# Patient Record
Sex: Female | Born: 2000
Health system: Midwestern US, Community
[De-identification: ages and names within clinical notes are randomized; demographics above are authoritative.]

## PROBLEM LIST (undated history)

## (undated) DIAGNOSIS — J45909 Unspecified asthma, uncomplicated: Secondary | ICD-10-CM

## (undated) DIAGNOSIS — J939 Pneumothorax, unspecified: Secondary | ICD-10-CM

## (undated) DIAGNOSIS — K828 Other specified diseases of gallbladder: Secondary | ICD-10-CM

## (undated) DIAGNOSIS — K812 Acute cholecystitis with chronic cholecystitis: Secondary | ICD-10-CM

---
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
## 2019-08-14 IMAGING — CR DG WRIST COMPLETE 3+V*L*
4 series · 4 of 4 positions shown · non-contrast
Comparison: None.

CLINICAL DATA: Persistent pain after falling down steps today

EXAM:
LEFT WRIST - COMPLETE 3+ VIEW

[wrist pa]
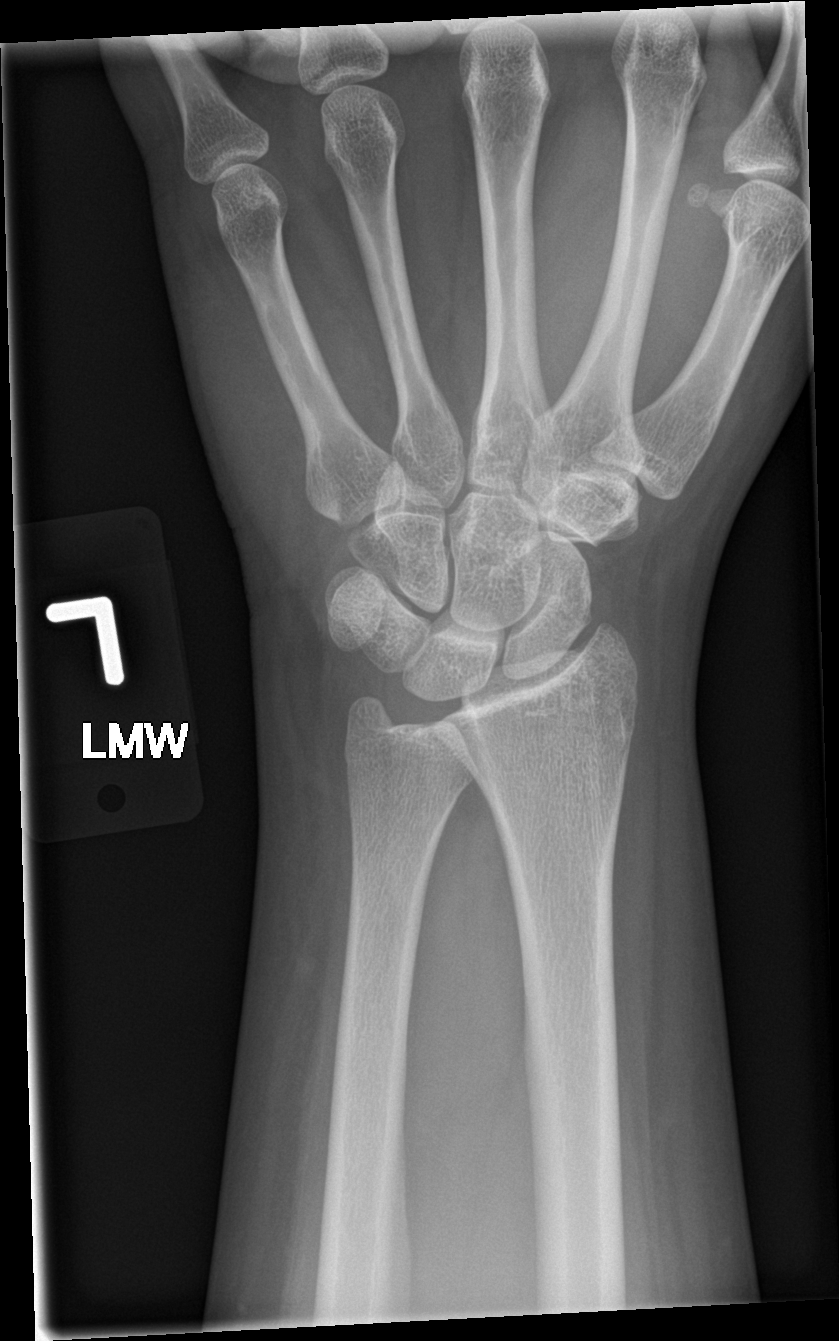

[wrist obl]
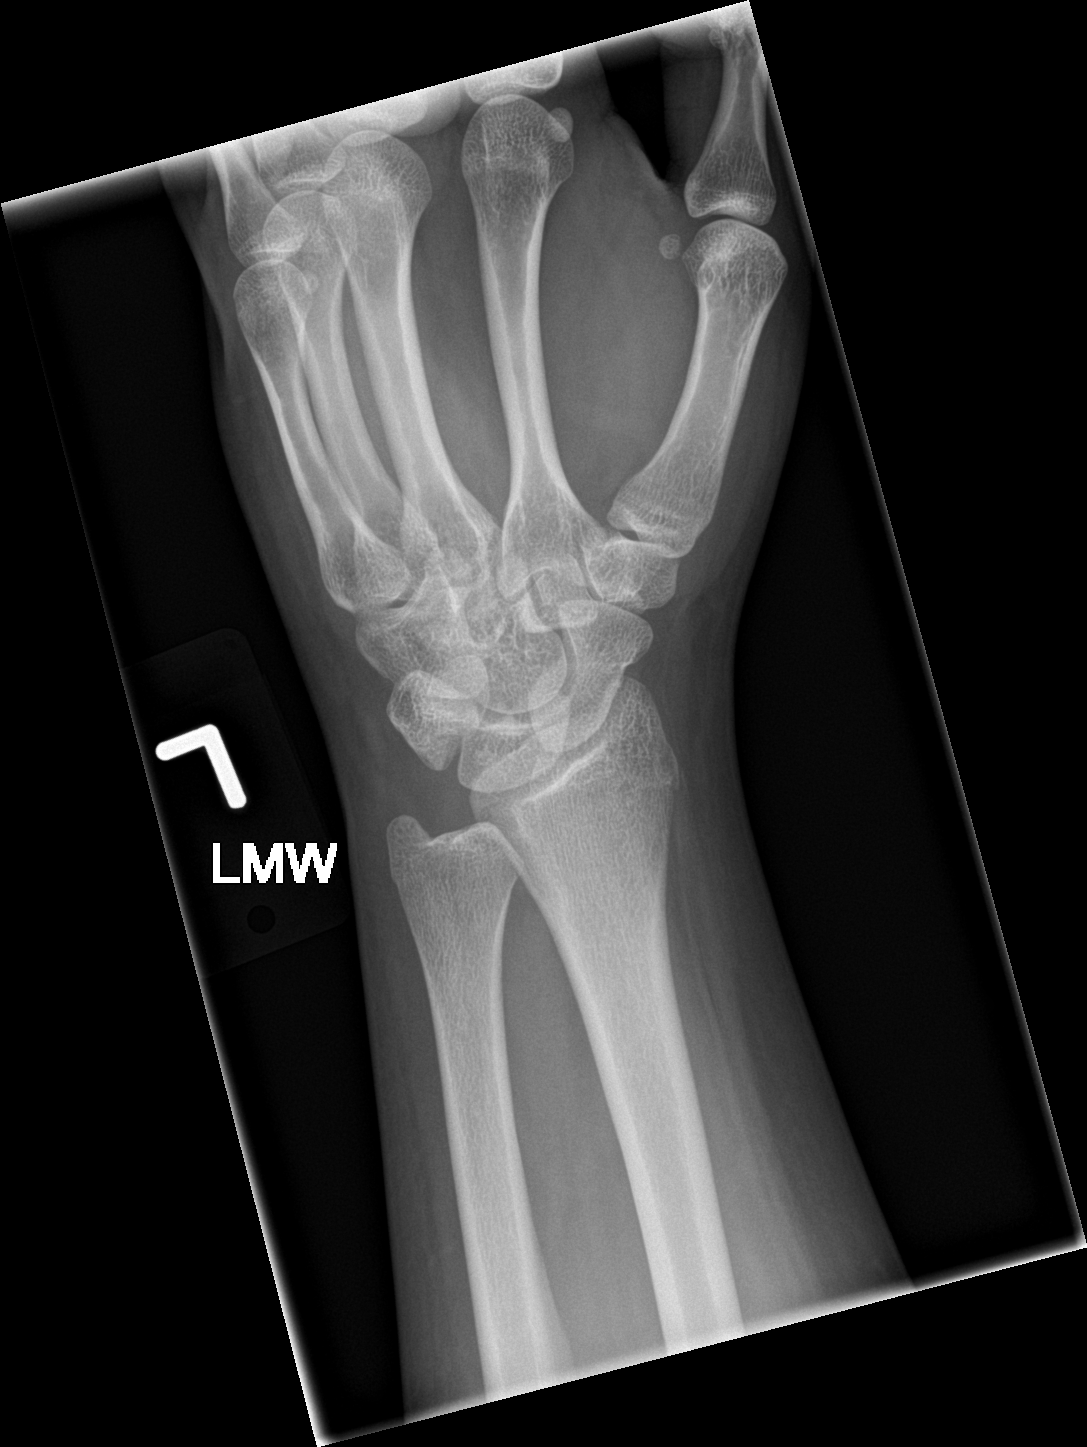

[wrist lat]
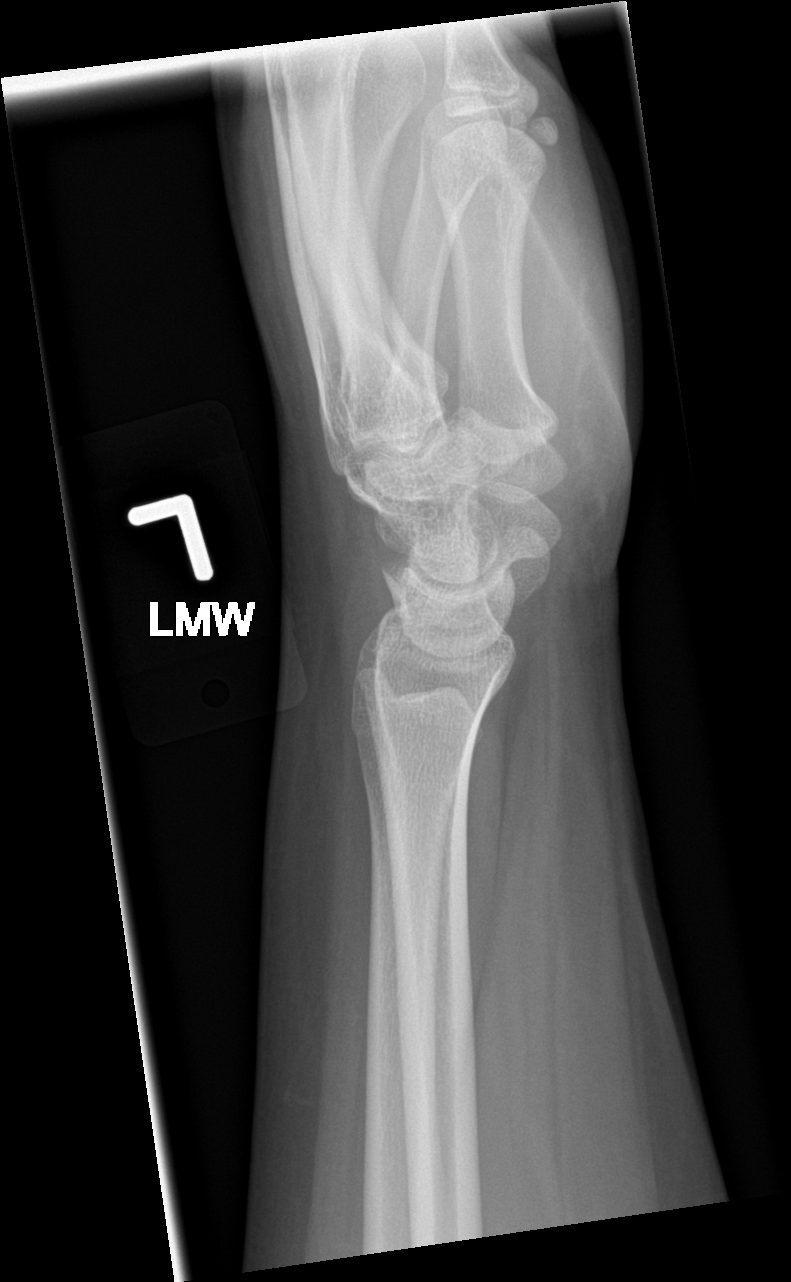

[navicular]
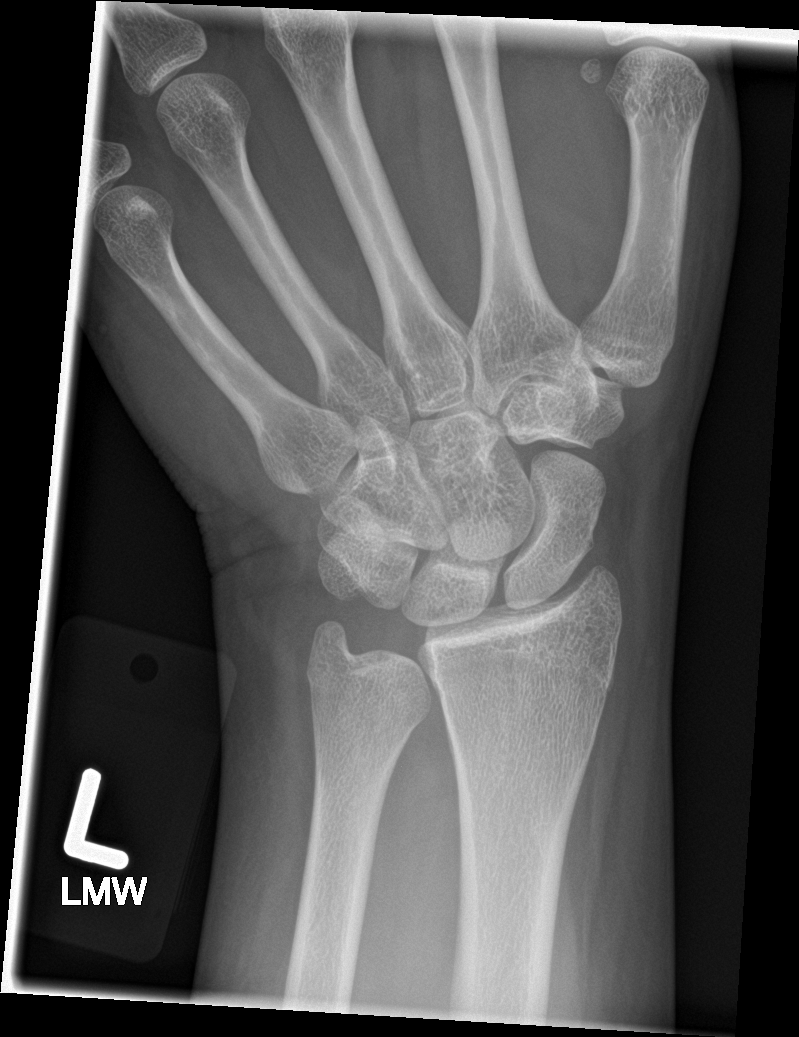

[4 of 4 positions shown; findings below may reference images not displayed]

FINDINGS: There is no evidence of fracture or dislocation. There is no
evidence of arthropathy or other focal bone abnormality. Soft
tissues are unremarkable.
IMPRESSION: Negative.

## 2022-03-12 ENCOUNTER — Ambulatory Visit: Admit: 2022-03-12 | Discharge: 2022-03-12 | Payer: PRIVATE HEALTH INSURANCE | Primary: Family

## 2022-03-12 DIAGNOSIS — K828 Other specified diseases of gallbladder: Secondary | ICD-10-CM

## 2022-03-12 NOTE — H&P (View-Only) (Signed)
[facility] Note        Chief Complaint   Patient presents with    New Patient    Surgical Consult     Gallbladder        PCP: Kaley N Day, APRN - CNP    HPI: Christina Taylor is a 21 y.o. female who presents in consultation for abdominal pain. Patient states that over the past month or so she has been having increasingly worse abdominal pain particularly after eating. She says that the pain comes on about 20-30 minutes following a meal and is in her epigastrium/RUQ with radiation around to her scapula. She has been seen in an outside hospital ED where they did CT scan, US, and HIDA scan. I do not have CT report but US showed sludge and HIDA scan showed GB EF of 28% which is below normal. She also reports some nausea and dry heaving as well as diarrhea.     Past Medical History:   Diagnosis Date    Anxiety        History reviewed. No pertinent surgical history.    Prior to Admission medications    Medication Sig Start Date End Date Taking? Authorizing Provider   traZODone (DESYREL) 150 MG tablet Take 1 tablet by mouth nightly   Yes Historical Provider, MD   hydrOXYzine pamoate (VISTARIL) 25 MG capsule Take 1 capsule by mouth 2 times daily as needed for Anxiety   Yes Historical Provider, MD   ibuprofen (ADVIL;MOTRIN) 200 MG tablet Take 1 tablet by mouth every 6 hours as needed for Pain   Yes Historical Provider, MD   calcium carbonate (TUMS) 500 MG chewable tablet Take 1 tablet by mouth daily   Yes Historical Provider, MD   famotidine (PEPCID) 40 MG tablet Take 1 tablet by mouth daily   Yes Historical Provider, MD   cetirizine (ZYRTEC) 10 MG tablet Take 1 tablet by mouth daily   Yes Historical Provider, MD   melatonin 3 MG TABS tablet Take 1 tablet by mouth daily   Yes Historical Provider, MD   norethindrone-ethinyl estradiol (JUNEL FE 1/20) 1-20 MG-MCG per tablet Take 1 tablet by mouth daily   Yes Historical Provider, MD   metFORMIN (GLUCOPHAGE) 500 MG tablet Take 1 tablet by mouth 2 times daily  (with meals)   Yes Historical Provider, MD   methylphenidate (RITALIN) 10 MG tablet Take 1 tablet by mouth 2 times daily. Max Daily Amount: 20 mg   Yes Historical Provider, MD   SITagliptin (JANUVIA) 50 MG tablet Take 1 tablet by mouth daily   Yes Historical Provider, MD       Allergies   Allergen Reactions    Latex Hives and Itching    Theraflu Severe Cold & Cough [Phenyleph-Diphenhyd-Dm-Apap] Hives    Contrast [Iodides] Hives and Itching    Red Dye Hives and Itching       Social History     Socioeconomic History    Marital status: Single     Spouse name: None    Number of children: None    Years of education: None    Highest education level: None   Tobacco Use    Smoking status: Never    Smokeless tobacco: Never   Substance and Sexual Activity    Alcohol use: Never    Drug use: Never       Family History   Problem Relation Age of Onset    Diabetes Maternal Grandmother       Cancer Paternal Grandmother        Review of Systems   All other systems reviewed and are negative.            Objective:  Vitals:    03/12/22 1009   BP: 125/80   Site: Left Upper Arm   Position: Sitting   Cuff Size: Medium Adult   Pulse: 68   Temp: 97.9 F (36.6 C)   TempSrc: Temporal   SpO2: 98%   Weight: 233 lb (105.7 kg)   Height: 5\' 7"  (1.702 m)          Physical Exam  Vitals reviewed.   Constitutional:       General: She is not in acute distress.     Appearance: Normal appearance.   HENT:      Head: Normocephalic and atraumatic.   Eyes:      Extraocular Movements: Extraocular movements intact.      Pupils: Pupils are equal, round, and reactive to light.   Cardiovascular:      Rate and Rhythm: Normal rate and regular rhythm.      Pulses: Normal pulses.      Heart sounds: Normal heart sounds.   Pulmonary:      Effort: Pulmonary effort is normal. No respiratory distress.      Breath sounds: Normal breath sounds.   Abdominal:      Comments: Soft, non-distended, mildly tender in right abdomen   Musculoskeletal:         General: Normal range of  motion.      Cervical back: Normal range of motion.   Skin:     General: Skin is warm and dry.   Neurological:      General: No focal deficit present.      Mental Status: She is alert and oriented to person, place, and time.   Psychiatric:         Mood and Affect: Mood normal.         Behavior: Behavior normal.         Assessment/Plan:      Diagnosis Orders   1. Biliary dyskinesia          Discussed with patient and mother that cholecystectomy may or may not help solve her problem but that I would be willing to try. They understand this and have elected to proceed with surgery.     Plan robotic xi assist laparoscopic cholecystectomy next week.    I have explained the risks, benefits, alternatives, and potential complications associated with the proposed procedure to be performed and other issues when applicable with the patient/responsible party prior to the procedure. All of their questions were answered as appropriate and to their satisfaction. They understand and agree to the procedure.     , DO    No follow-ups on file.      CC: Arvil Persons Day, APRN - CNP

## 2022-03-12 NOTE — H&P (Signed)
[facility] Note        Chief Complaint   Patient presents with    New Patient    Surgical Consult     Gallbladder        PCP: Linus Orn Day, APRN - CNP    HPI: Christina Taylor is a 21 y.o. female who presents in consultation for abdominal pain. Patient states that over the past month or so she has been having increasingly worse abdominal pain particularly after eating. She says that the pain comes on about 20-30 minutes following a meal and is in her epigastrium/RUQ with radiation around to her scapula. She has been seen in an outside hospital ED where they did CT scan, [place], and HIDA scan. I do not have CT report but US showed sludge and HIDA scan showed GB EF of 28% which is below normal. She also reports some nausea and dry heaving as well as diarrhea.     Past Medical History:   Diagnosis Date    Anxiety        History reviewed. No pertinent surgical history.    Prior to Admission medications    Medication Sig Start Date End Date Taking? Authorizing Provider   traZODone (DESYREL) 150 MG tablet Take 1 tablet by mouth nightly   Yes Historical Provider, MD   hydrOXYzine pamoate (VISTARIL) 25 MG capsule Take 1 capsule by mouth 2 times daily as needed for Anxiety   Yes Historical Provider, MD   ibuprofen (ADVIL;MOTRIN) 200 MG tablet Take 1 tablet by mouth every 6 hours as needed for Pain   Yes Historical Provider, MD   calcium carbonate (TUMS) 500 MG chewable tablet Take 1 tablet by mouth daily   Yes Historical Provider, MD   famotidine (PEPCID) 40 MG tablet Take 1 tablet by mouth daily   Yes Historical Provider, MD   cetirizine (ZYRTEC) 10 MG tablet Take 1 tablet by mouth daily   Yes Historical Provider, MD   melatonin 3 MG TABS tablet Take 1 tablet by mouth daily   Yes Historical Provider, MD   norethindrone-ethinyl estradiol (JUNEL FE 1/20) 1-20 MG-MCG per tablet Take 1 tablet by mouth daily   Yes Historical Provider, MD   metFORMIN (GLUCOPHAGE) 500 MG tablet Take 1 tablet by mouth 2 times daily  (with meals)   Yes Historical Provider, MD   methylphenidate (RITALIN) 10 MG tablet Take 1 tablet by mouth 2 times daily. Max Daily Amount: 20 mg   Yes Historical Provider, MD   SITagliptin (JANUVIA) 50 MG tablet Take 1 tablet by mouth daily   Yes Historical Provider, MD       Allergies   Allergen Reactions    Latex Hives and Itching    Theraflu Severe Cold & Cough [Phenyleph-Diphenhyd-Dm-Apap] Hives    Contrast [Iodides] Hives and Itching    Red Dye Hives and Itching       Social History     Socioeconomic History    Marital status: Single     Spouse name: None    Number of children: None    Years of education: None    Highest education level: None   Tobacco Use    Smoking status: Never    Smokeless tobacco: Never   Substance and Sexual Activity    Alcohol use: Never    Drug use: Never       Family History   Problem Relation Age of Onset    Diabetes Maternal Grandmother  Cancer Paternal Grandmother        Review of Systems   All other systems reviewed and are negative.            Objective:  Vitals:    03/12/22 1009   BP: 125/80   Site: Left Upper Arm   Position: Sitting   Cuff Size: Medium Adult   Pulse: 68   Temp: 97.9 F (36.6 C)   TempSrc: Temporal   SpO2: 98%   Weight: 233 lb (105.7 kg)   Height: 5\' 7"  (1.702 m)          Physical Exam  Vitals reviewed.   Constitutional:       General: She is not in acute distress.     Appearance: Normal appearance.   HENT:      Head: Normocephalic and atraumatic.   Eyes:      Extraocular Movements: Extraocular movements intact.      Pupils: Pupils are equal, round, and reactive to light.   Cardiovascular:      Rate and Rhythm: Normal rate and regular rhythm.      Pulses: Normal pulses.      Heart sounds: Normal heart sounds.   Pulmonary:      Effort: Pulmonary effort is normal. No respiratory distress.      Breath sounds: Normal breath sounds.   Abdominal:      Comments: Soft, non-distended, mildly tender in right abdomen   Musculoskeletal:         General: Normal range of  motion.      Cervical back: Normal range of motion.   Skin:     General: Skin is warm and dry.   Neurological:      General: No focal deficit present.      Mental Status: She is alert and oriented to person, place, and time.   Psychiatric:         Mood and Affect: Mood normal.         Behavior: Behavior normal.         Assessment/Plan:      Diagnosis Orders   1. Biliary dyskinesia          Discussed with patient and mother that cholecystectomy may or may not help solve her problem but that I would be willing to try. They understand this and have elected to proceed with surgery.     Plan robotic xi assist laparoscopic cholecystectomy next week.    I have explained the risks, benefits, alternatives, and potential complications associated with the proposed procedure to be performed and other issues when applicable with the patient/responsible party prior to the procedure. All of their questions were answered as appropriate and to their satisfaction. They understand and agree to the procedure.     , DO    No follow-ups on file.      CC: Arvil Persons Day, APRN - CNP

## 2022-03-12 NOTE — Telephone Encounter (Signed)
Scheduled patient for laparoscopic cholecystectomy on 03/18/22 at 8:30am in [facility]. Patient needs to report at the front entrance 2 hours before the procedure, NPO after the midnight the night before the procedure. No ASA products for 7 days. Patient verbalized understanding. instruction letter handed. Encouraged to call our office if any questions.  Electronically signed by Ronne Binning April Kayia Billinger on 03/12/2022 at 11:05 AM

## 2022-03-12 NOTE — Telephone Encounter (Signed)
Prior Authorization Form:      DEMOGRAPHICS:                     Patient Name:  Christina Taylor  Patient DOB:  2000/09/04            Insurance:  Payor: [employer]vertising [employer] / Plan: [employer]vertising copy[employer]AN [place] / Product Type: *No Product type* /   Insurance ID Number:    Payer/Plan Subscr DOB Sex Relation Sub. Ins. ID Effective Group Num   1. [employer]* Barrientes,Rebeka 2000/11/13 Female Self [id number] 07/[birth year] [id number]                                   [address]         DIAGNOSIS & PROCEDURE:                       Procedure/Operation: robotic laparoscopic cholecystectomy            CPT Code: 60109    Diagnosis:  biliary dyskinesia     ICD10 Code: K82.8    Location:  [place]     Surgeon:  Dr. Bennie Hind INFORMATION:                          Date: 03/18/22    Time: 8:30am              Anesthesia:  General                                                       Status:  Outpatient        Special Comments:  N/A       Electronically signed by Ronne Binning April Gabriella Woodhead on 03/12/2022 at 11:02 AM

## 2022-03-16 NOTE — Progress Notes (Signed)
[facility] PRE-ADMISSION TESTING INSTRUCTIONS    The Preadmission Testing patient is instructed accordingly using the following criteria (check applicable):    ARRIVAL INSTRUCTIONS:  [x]  Parking the day of Surgery is located in the Main Entrance lot.  Upon entering the door, make an immediate right to the surgery reception desk    [x]  Bring photo ID and insurance card    [x]  Bring in a copy of Living will or Durable Power of attorney papers.    [x]  Please be sure to arrange for responsible adult to provide transportation to and from the hospital    [x]  Please arrange for responsible adult to be with you for the 24 hour period post procedure due to having anesthesia    [x]  If you awake am of surgery not feeling well or have temperature >100 please call [phone]    GENERAL INSTRUCTIONS:    [x]  Nothing by mouth after midnight, including gum, candy, mints or water    [x]  You may brush your teeth, but do not swallow any water    [x]  Take medications as instructed with 1-2 oz of water    [x]  Stop herbal supplements and vitamins 5 days prior to procedure    []  Follow preop dosing of blood thinners per physician instructions    []  Take 1/2 dose of evening insulin, but no insulin after midnight    [x]  No oral diabetic medications after midnight    [x]  If diabetic and have low blood sugar or feel symptomatic, take 1-2oz apple juice only    []  Bring inhalers day of surgery    []  Bring C-PAP/ Bi-Pap day of surgery    [x]  Bring urine specimen day of surgery    [x]  Shower or bath with soap, lather and rinse well, AM of Surgery, no lotion, powders or creams to surgical site    []  Follow bowel prep as instructed per surgeon    [x]  No tobacco products within 24 hours of surgery     [x]  No alcohol or illegal drug use within 24 hours of surgery.    [x]  Jewelry, body piercing's, eyeglasses, contact lenses and dentures are not permitted into surgery (bring cases)      [x]  Please do not wear any nail  polish, make up or hair products on the day of surgery    [x]  You can expect a call the business day prior to procedure to notify you if your arrival time changes    [x]  If you receive a survey after surgery we would greatly appreciate your comments    []  Parent/guardian of a minor must accompany their child and remain on the premises  the entire time they are under our care     []  Pediatric patients may bring favorite toy, blanket or comfort item with them    []  A caregiver or family member must remain with the patient during their stay if they are mentally handicapped, have dementia, disoriented or unable to use a call light or would be a safety concern if left unattended    [x]  Please notify surgeon if you develop any illness between now and time of surgery (cold, cough, sore throat, fever, nausea, vomiting) or any signs of infections  including skin, wounds, and dental.    [x]   The Outpatient Pharmacy is available to fill your prescription here on your day of surgery, ask your preop nurse for details    []  Other instructions    EDUCATIONAL MATERIALS  PROVIDED:    [] PAT Preoperative Education Packet/Booklet     [] Medication List    [] Transfusion bracelet applied with instructions    [] Shower with soap, lather and rinse well, and use CHG wipes provided the evening before surgery as instructed    [] Incentive spirometer with instructions

## 2022-03-18 ENCOUNTER — Inpatient Hospital Stay: Payer: Medicaid (Managed Care)

## 2022-03-18 LAB — EKG 12-LEAD
Atrial Rate: 73 {beats}/min
P Axis: 28 degrees
P-R Interval: 160 ms
Q-T Interval: 382 ms
QRS Duration: 92 ms
QTc Calculation (Bazett): 420 ms
R Axis: 47 degrees
T Axis: 29 degrees
Ventricular Rate: 73 {beats}/min

## 2022-03-18 LAB — CBC
Hematocrit: 42.1 % (ref 34.0–48.0)
Hemoglobin: 13.7 g/dL (ref 11.5–15.5)
MCH: 28.4 pg (ref 26.0–35.0)
MCHC: 32.5 g/dL (ref 32.0–34.5)
MCV: 87.3 fL (ref 80.0–99.9)
MPV: 9.5 fL (ref 7.0–12.0)
Platelets: 275 10*3/uL (ref 130–450)
RBC: 4.82 m/uL (ref 3.50–5.50)
RDW: 13.1 % (ref 11.5–15.0)
WBC: 10.8 10*3/uL (ref 4.5–11.5)

## 2022-03-18 LAB — BASIC METABOLIC PANEL
Anion Gap: 12 mmol/L (ref 7–16)
BUN: 8 mg/dL (ref 6–20)
CO2: 22 mmol/L (ref 22–29)
Calcium: 9 mg/dL (ref 8.6–10.2)
Chloride: 106 mmol/L (ref 98–107)
Creatinine: 0.8 mg/dL (ref 0.50–1.00)
Est, Glom Filt Rate: >60 mL/min/{1.73_m2} (ref >60)
Glucose: 156 mg/dL — ABNORMAL HIGH (ref 74–99)
Potassium: 4.2 mmol/L (ref 3.5–5.0)
Sodium: 140 mmol/L (ref 132–146)

## 2022-03-18 LAB — POCT GLUCOSE
POC Glucose: 137 mg/dL — ABNORMAL HIGH (ref 74–99)
POC Glucose: 185 mg/dL — ABNORMAL HIGH (ref 74–99)

## 2022-03-18 LAB — POC PREGNANCY UR-QUAL
HCG, Urine, POC: NEGATIVE
Lot Number: 644487

## 2022-03-18 MED ORDER — SODIUM CHLORIDE 0.9 % IV SOLN
0.9 % | INTRAVENOUS | Status: DC | PRN
Start: 2022-03-18 — End: 2022-03-18

## 2022-03-18 MED ORDER — SODIUM CHLORIDE 0.9 % IV SOLN
0.9 % | INTRAVENOUS | Status: DC | PRN
Start: 2022-03-18 — End: 2022-03-18
  Administered 2022-03-18 (×2): via INTRAVENOUS

## 2022-03-18 MED ORDER — FENTANYL CITRATE (PF) 100 MCG/2ML IJ SOLN
100 MCG/2ML | INTRAMUSCULAR | Status: AC
Start: 2022-03-18 — End: ?

## 2022-03-18 MED ORDER — MIDAZOLAM HCL 2 MG/2ML IJ SOLN
2 MG/ML | INTRAMUSCULAR | Status: DC | PRN
Start: 2022-03-18 — End: 2022-03-18
  Administered 2022-03-18: 12:00:00 2 via INTRAVENOUS

## 2022-03-18 MED ORDER — PROPOFOL 200 MG/20ML IV EMUL
200 MG/20ML | INTRAVENOUS | Status: AC
Start: 2022-03-18 — End: ?

## 2022-03-18 MED ORDER — DEXAMETHASONE SODIUM PHOSPHATE 20 MG/5ML IJ SOLN
20 MG/5ML | INTRAMUSCULAR | Status: AC
Start: 2022-03-18 — End: ?

## 2022-03-18 MED ORDER — BUPIVACAINE HCL (PF) 0.25 % IJ SOLN
0.25 % | INTRAMUSCULAR | Status: AC
Start: 2022-03-18 — End: ?

## 2022-03-18 MED ORDER — FENTANYL CITRATE (PF) 250 MCG/5ML IJ SOLN
250 MCG/5ML | INTRAMUSCULAR | Status: DC | PRN
Start: 2022-03-18 — End: 2022-03-18
  Administered 2022-03-18: 13:00:00 50 via INTRAVENOUS
  Administered 2022-03-18: 13:00:00 100 via INTRAVENOUS
  Administered 2022-03-18: 14:00:00 25 via INTRAVENOUS
  Administered 2022-03-18 (×2): 50 via INTRAVENOUS
  Administered 2022-03-18: 14:00:00 25 via INTRAVENOUS

## 2022-03-18 MED ORDER — PROCHLORPERAZINE EDISYLATE 10 MG/2ML IJ SOLN
10 MG/2ML | Freq: Once | INTRAMUSCULAR | Status: DC | PRN
Start: 2022-03-18 — End: 2022-03-18

## 2022-03-18 MED ORDER — ONDANSETRON HCL 4 MG/2ML IJ SOLN
4 MG/2ML | INTRAMUSCULAR | Status: AC
Start: 2022-03-18 — End: ?

## 2022-03-18 MED ORDER — ROCURONIUM BROMIDE 50 MG/5ML IV SOLN
50 MG/5ML | INTRAVENOUS | Status: AC
Start: 2022-03-18 — End: ?

## 2022-03-18 MED ORDER — FENTANYL CITRATE (PF) 250 MCG/5ML IJ SOLN
250 MCG/5ML | INTRAMUSCULAR | Status: AC
Start: 2022-03-18 — End: ?

## 2022-03-18 MED ORDER — ACETAMINOPHEN 500 MG PO TABS
500 MG | Freq: Once | ORAL | Status: AC
Start: 2022-03-18 — End: 2022-03-18
  Administered 2022-03-18: 12:00:00 1000 mg via ORAL

## 2022-03-18 MED ORDER — ENOXAPARIN SODIUM 40 MG/0.4ML IJ SOSY
40 MG/0.4ML | Freq: Once | INTRAMUSCULAR | Status: AC
Start: 2022-03-18 — End: 2022-03-18
  Administered 2022-03-18: 12:00:00 40 mg via SUBCUTANEOUS

## 2022-03-18 MED ORDER — MEPERIDINE HCL 25 MG/ML IJ SOLN
25 MG/ML | INTRAMUSCULAR | Status: DC | PRN
Start: 2022-03-18 — End: 2022-03-18

## 2022-03-18 MED ORDER — DEXAMETHASONE SODIUM PHOSPHATE 20 MG/5ML IJ SOLN
20 MG/5ML | INTRAMUSCULAR | Status: DC | PRN
Start: 2022-03-18 — End: 2022-03-18
  Administered 2022-03-18: 13:00:00 8 via INTRAVENOUS

## 2022-03-18 MED ORDER — STERILE WATER FOR INJECTION (MIXTURES ONLY)
2 g | Status: AC
Start: 2022-03-18 — End: 2022-03-18
  Administered 2022-03-18: 13:00:00 2000 mg via INTRAVENOUS

## 2022-03-18 MED ORDER — LIDOCAINE HCL (PF) 2 % IJ SOLN
2 % | INTRAMUSCULAR | Status: AC
Start: 2022-03-18 — End: ?

## 2022-03-18 MED ORDER — HYDROMORPHONE HCL 1 MG/ML IJ SOLN
1 MG/ML | INTRAMUSCULAR | Status: DC | PRN
Start: 2022-03-18 — End: 2022-03-18

## 2022-03-18 MED ORDER — NORMAL SALINE FLUSH 0.9 % IV SOLN
0.9 % | INTRAVENOUS | Status: DC | PRN
Start: 2022-03-18 — End: 2022-03-18

## 2022-03-18 MED ORDER — SUGAMMADEX SODIUM 500 MG/5ML IV SOLN
500 MG/5ML | INTRAVENOUS | Status: AC
Start: 2022-03-18 — End: ?

## 2022-03-18 MED ORDER — MIDAZOLAM HCL 2 MG/2ML IJ SOLN
2 MG/ML | INTRAMUSCULAR | Status: AC
Start: 2022-03-18 — End: ?

## 2022-03-18 MED ORDER — NORMAL SALINE FLUSH 0.9 % IV SOLN
0.9 % | Freq: Two times a day (BID) | INTRAVENOUS | Status: DC
Start: 2022-03-18 — End: 2022-03-18

## 2022-03-18 MED ORDER — FENTANYL CITRATE (PF) 100 MCG/2ML IJ SOLN
100 MCG/2ML | INTRAMUSCULAR | Status: DC | PRN
Start: 2022-03-18 — End: 2022-03-18
  Administered 2022-03-18 (×2): 25 ug via INTRAVENOUS

## 2022-03-18 MED ORDER — PROPOFOL 200 MG/20ML IV EMUL
200 MG/20ML | INTRAVENOUS | Status: DC | PRN
Start: 2022-03-18 — End: 2022-03-18
  Administered 2022-03-18: 13:00:00 200 via INTRAVENOUS

## 2022-03-18 MED ORDER — ROCURONIUM BROMIDE 50 MG/5ML IV SOLN
50 MG/5ML | INTRAVENOUS | Status: DC | PRN
Start: 2022-03-18 — End: 2022-03-18
  Administered 2022-03-18: 13:00:00 50 via INTRAVENOUS
  Administered 2022-03-18: 13:00:00 10 via INTRAVENOUS

## 2022-03-18 MED ORDER — LACTATED RINGERS IV SOLN
INTRAVENOUS | Status: DC
Start: 2022-03-18 — End: 2022-03-18

## 2022-03-18 MED ORDER — SUGAMMADEX SODIUM 500 MG/5ML IV SOLN
500 MG/5ML | INTRAVENOUS | Status: DC | PRN
Start: 2022-03-18 — End: 2022-03-18
  Administered 2022-03-18: 14:00:00 400 via INTRAVENOUS

## 2022-03-18 MED ORDER — OXYCODONE HCL 5 MG PO TABS
5 MG | ORAL_TABLET | Freq: Four times a day (QID) | ORAL | 0 refills | Status: AC | PRN
Start: 2022-03-18 — End: 2022-03-21
  Filled 2022-03-18: qty 5, 2d supply, fill #0

## 2022-03-18 MED ORDER — SENNOSIDES 8.6 MG PO TABS
8.6 MG | ORAL_TABLET | Freq: Two times a day (BID) | ORAL | 0 refills | Status: AC
Start: 2022-03-18 — End: 2022-03-25
  Filled 2022-03-18: qty 14, 7d supply, fill #0

## 2022-03-18 MED ORDER — ONDANSETRON HCL 4 MG/2ML IJ SOLN
4 MG/2ML | INTRAMUSCULAR | Status: DC | PRN
Start: 2022-03-18 — End: 2022-03-18
  Administered 2022-03-18: 14:00:00 4 via INTRAVENOUS

## 2022-03-18 MED ORDER — ACETAMINOPHEN 325 MG PO TABS
325 MG | Freq: Four times a day (QID) | ORAL | Status: AC
Start: 2022-03-18 — End: 2022-03-23

## 2022-03-18 MED ORDER — BUPIVACAINE HCL (PF) 0.25 % IJ SOLN
0.25 % | INTRAMUSCULAR | Status: DC | PRN
Start: 2022-03-18 — End: 2022-03-18
  Administered 2022-03-18: 14:00:00 50 via INTRADERMAL

## 2022-03-18 MED ORDER — LIDOCAINE HCL (PF) 2 % IJ SOLN
2 % | INTRAMUSCULAR | Status: DC | PRN
Start: 2022-03-18 — End: 2022-03-18
  Administered 2022-03-18: 13:00:00 40 via INTRAVENOUS

## 2022-03-18 MED FILL — ONDANSETRON HCL 4 MG/2ML IJ SOLN: 4 MG/2ML | INTRAMUSCULAR | Qty: 2

## 2022-03-18 MED FILL — DEXAMETHASONE SODIUM PHOSPHATE 20 MG/5ML IJ SOLN: 20 MG/5ML | INTRAMUSCULAR | Qty: 5

## 2022-03-18 MED FILL — DIPRIVAN 200 MG/20ML IV EMUL: 200 MG/20ML | INTRAVENOUS | Qty: 40

## 2022-03-18 MED FILL — FENTANYL CITRATE (PF) 100 MCG/2ML IJ SOLN: 100 MCG/2ML | INTRAMUSCULAR | Qty: 2

## 2022-03-18 MED FILL — XYLOCAINE-MPF 2 % IJ SOLN: 2 % | INTRAMUSCULAR | Qty: 5

## 2022-03-18 MED FILL — BUPIVACAINE HCL (PF) 0.25 % IJ SOLN: 0.25 % | INTRAMUSCULAR | Qty: 60

## 2022-03-18 MED FILL — ROCURONIUM BROMIDE 50 MG/5ML IV SOLN: 50 MG/5ML | INTRAVENOUS | Qty: 5

## 2022-03-18 MED FILL — BRIDION 500 MG/5ML IV SOLN: 500 MG/5ML | INTRAVENOUS | Qty: 5

## 2022-03-18 MED FILL — CEFAZOLIN SODIUM 2 G IJ SOLR: 2 g | INTRAMUSCULAR | Qty: 2000

## 2022-03-18 MED FILL — MIDAZOLAM HCL 2 MG/2ML IJ SOLN: 2 MG/ML | INTRAMUSCULAR | Qty: 2

## 2022-03-18 MED FILL — FENTANYL CITRATE (PF) 250 MCG/5ML IJ SOLN: 250 MCG/5ML | INTRAMUSCULAR | Qty: 5

## 2022-03-18 MED FILL — ACETAMINOPHEN EXTRA STRENGTH 500 MG PO TABS: 500 MG | ORAL | Qty: 2

## 2022-03-18 MED FILL — ENOXAPARIN SODIUM 40 MG/0.4ML IJ SOSY: 40 MG/0.4ML | INTRAMUSCULAR | Qty: 0.4

## 2022-03-18 NOTE — Progress Notes (Signed)
U preg negative lot [id number] exp 06-14-2023

## 2022-03-18 NOTE — Discharge Instructions (Addendum)
Stick to a low fat diet after having your gallbladder out to avoid diarrhea or bloating.  Avoid fried food, fatty food, and fast food.

## 2022-03-18 NOTE — Interval H&P Note (Signed)
Update History & Physical    The patient's History and Physical of March 12, 2022 was reviewed with the patient and I examined the patient. There was no change. The surgical site was confirmed by the patient and me.     Plan: The risks, benefits, expected outcome, and alternative to the recommended procedure have been discussed with the patient. Patient understands and wants to proceed with the procedure.     Electronically signed by Arvil Persons, DO on 03/18/2022 at 7:04 AM

## 2022-03-18 NOTE — Anesthesia Post-Procedure Evaluation (Signed)
Department of Anesthesiology  Postprocedure Note    Patient: Christina Taylor  MRN: [id number]  Birthdate: August 17, 2000  Date of evaluation: 03/18/2022      Procedure Summary     Date: 03/18/22 Room / Location: [facility] OR 09 / [facility]    Anesthesia Start: [phone] Anesthesia Stop: 1018    Procedure: LAPAROSCOPIC ROBOTIC XI ASSISTED CHOLECYSTECTOMY Diagnosis:       Acute cholecystitis      (Biliary dyskinesia [K82.8])    Surgeons: Arvil Persons, DO Responsible Provider: Jeni Salles., MD    Anesthesia Type: general ASA Status: 3          Anesthesia Type: No value filed.    Aldrete Phase I: Aldrete Score: 10    Aldrete Phase II: Aldrete Score: 10      Anesthesia Post Evaluation    Patient location during evaluation: PACU  Patient participation: complete - patient participated  Level of consciousness: awake and alert  Airway patency: patent  Nausea & Vomiting: no vomiting and no nausea  Complications: no  Cardiovascular status: blood pressure returned to baseline  Respiratory status: acceptable  Hydration status: euvolemic  Multimodal analgesia pain management approach  Pain management: adequate

## 2022-03-18 NOTE — Progress Notes (Signed)
Discharge instructions given verbalized understanding

## 2022-03-18 NOTE — Progress Notes (Signed)
Nourishment given tolerating without difficulty

## 2022-03-18 NOTE — Progress Notes (Signed)
CLINICAL PHARMACY NOTE: MEDS TO BEDS    Total # of Prescriptions Filled: 2   The following medications were delivered to the patient:  Senna 8.6 mg   Oxycodone 5 mg       Additional Documentation:

## 2022-03-18 NOTE — Anesthesia Pre-Procedure Evaluation (Addendum)
Department of Anesthesiology  Preprocedure Note       Name:  Christina Taylor   Age:  21 y.o.  DOB:  17-Feb-2001                                          MRN:  [id number]         Date:  03/18/2022      Surgeon: Moishe Spice):  Arvil Persons, DO    Procedure: Procedure(s):  LAPAROSCOPIC ROBOTIC XI ASSISTED CHOLECYSTECTOMY POSSIBLE OPEN POSSIBLE GRAM     ++LATEX ALLERGY++   ++IODINE ALLERGY++    Medications prior to admission:   Prior to Admission medications    Medication Sig Start Date End Date Taking? Authorizing Provider   acetaminophen (TYLENOL) 325 MG tablet Take 2 tablets by mouth every 6 hours as needed for Pain   Yes Historical Provider, MD   traZODone (DESYREL) 150 MG tablet Take 1 tablet by mouth nightly as needed    Historical Provider, MD   hydrOXYzine pamoate (VISTARIL) 25 MG capsule Take 1 capsule by mouth 2 times daily as needed for Anxiety    Historical Provider, MD   ibuprofen (ADVIL;MOTRIN) 200 MG tablet Take 1 tablet by mouth every 6 hours as needed for Pain  Patient not taking: Reported on 03/16/2022    Historical Provider, MD   calcium carbonate (TUMS) 500 MG chewable tablet Take 1 tablet by mouth daily    Historical Provider, MD   famotidine (PEPCID) 40 MG tablet Take 1 tablet by mouth at bedtime    Historical Provider, MD   cetirizine (ZYRTEC) 10 MG tablet Take 1 tablet by mouth daily    Historical Provider, MD   norethindrone-ethinyl estradiol (JUNEL FE 1/20) 1-20 MG-MCG per tablet Take 1 tablet by mouth daily    Historical Provider, MD   metFORMIN (GLUCOPHAGE) 500 MG tablet Take 1 tablet by mouth 2 times daily (with meals)    Historical Provider, MD   methylphenidate (RITALIN) 10 MG tablet Take 1 tablet by mouth 2 times daily. Max Daily Amount: 20 mg    Historical Provider, MD   SITagliptin (JANUVIA) 50 MG tablet Take 1 tablet by mouth daily    Historical Provider, MD       Current medications:    Current Facility-Administered Medications   Medication Dose Route Frequency Provider Last Rate Last  Admin   . sodium chloride flush 0.9 % injection 5-40 mL  5-40 mL IntraVENous 2 times per day Arvil Persons, DO       . sodium chloride flush 0.9 % injection 5-40 mL  5-40 mL IntraVENous PRN Arvil Persons, DO       . 0.9 % sodium chloride infusion   IntraVENous PRN Arvil Persons, DO       . ceFAZolin (ANCEF) 2,000 mg in sterile water 20 mL IV syringe  2,000 mg IntraVENous On Call to OR Arvil Persons, DO       . enoxaparin (LOVENOX) injection 40 mg  40 mg SubCUTAneous Once Adam J Orengia, DO       . acetaminophen (TYLENOL) tablet 1,000 mg  1,000 mg Oral Once Adam J Orengia, DO       . lactated ringers IV soln infusion   IntraVENous Continuous Arvil Persons, DO           Allergies:    Allergies  Allergen Reactions   . Latex Hives and Itching   . Theraflu Severe Cold & Cough [Phenyleph-Diphenhyd-Dm-Apap] Hives   . Contrast [Iodides] Hives and Itching   . Red Dye Hives and Itching       Problem List:  There is no problem list on file for this patient.      Past Medical History:        Diagnosis Date   . Anxiety    . Diabetes mellitus (HCC)    . Gallbladder disease    . Obesity        Past Surgical History:  History reviewed. No pertinent surgical history.    Social History:    Social History     Tobacco Use   . Smoking status: Never   . Smokeless tobacco: Never   Substance Use Topics   . Alcohol use: Never                                Counseling given: Not Answered      Vital Signs (Current):   Vitals:    03/16/22 1529   Weight: 233 lb (105.7 kg)   Height: 5\' 7"  (1.702 m)                                              BP Readings from Last 3 Encounters:   03/12/22 125/80       NPO Status:                                                                                 BMI:   Wt Readings from Last 3 Encounters:   03/16/22 233 lb (105.7 kg)   03/12/22 233 lb (105.7 kg)     Body mass index is 36.49 kg/m.    CBC: No results found for: WBC, RBC, HGB, HCT, MCV, RDW, PLT    CMP: No results found for: NA, K, CL, CO2, BUN,  CREATININE, GFRAA, AGRATIO, LABGLOM, GLUCOSE, GLU, PROT, CALCIUM, BILITOT, ALKPHOS, AST, ALT    POC Tests: No results for input(s): POCGLU, POCNA, POCK, POCCL, POCBUN, POCHEMO, POCHCT in the last 72 hours.    Coags: No results found for: PROTIME, INR, APTT    HCG (If Applicable): No results found for: PREGTESTUR, PREGSERUM, HCG, HCGQUANT     ABGs: No results found for: PHART, PO2ART, PCO2ART, HCO3ART, BEART, O2SATART     Type & Screen (If Applicable):  No results found for: LABABO, LABRH    Drug/Infectious Status (If Applicable):  No results found for: HIV, HEPCAB    COVID-19 Screening (If Applicable): No results found for: COVID19        Anesthesia Evaluation  Patient summary reviewed and Nursing notes reviewed no history of anesthetic complications:   Airway: Mallampati: II  TM distance: >3 FB   Neck ROM: full  Mouth opening: > = 3 FB   Dental:          Pulmonary: breath sounds clear to auscultation  (+) asthma:     (-)  shortness of breath and not a current smoker                           Cardiovascular:Negative CV ROS  Exercise tolerance: good (>4 METS),           Rhythm: regular  Rate: normal           Beta Blocker:  Not on Beta Blocker         Neuro/Psych:   (+) seizures: well controlled, psychiatric history:            GI/Hepatic/Renal:   (+) GERD: well controlled,          ROS comment: cholelithiasis.   Endo/Other:    (+) DiabetesType II DM, , .                 Abdominal:             Vascular: negative vascular ROS.         Other Findings:           Anesthesia Plan      general     ASA 3       Induction: intravenous.      Anesthetic plan and risks discussed with patient and mother.                        Jeni Salles, MD   03/18/2022    Chart reviewed, patient seen and agree with above evaluation.  Meredeth Ide, APRN - CRNA

## 2022-03-18 NOTE — Op Note (Signed)
OPERATIVE REPORT    DATE OF PROCEDURE:  03/18/2022     SURGEON:  Surgeon(s):  Arvil Persons, DO     OR STAFF:  Circulator: Patty Sermons, RN  Scrub Person First: Anne Ng     PRE-OPERATIVE DIAGNOSIS:  Biliary dyskinesia    POST-OPERATIVE DIAGNOSIS:  Biliary dyskinesia  Acute cholecystitis    OPERATION(S) PERFORMED:  Laparoscopic Robotic XI Assisted Cholecystectomy    ANESTHESIA TYPE:  General     COMPLICATIONS:  None apparent    WOUND CLASS:  Class II    ESTIMATED BLOOD LOSS:  10 cc    SPECIMENS:  ID Type Source Tests Collected by Time Destination   A : GALLBLADDER Tissue Gallbladder SURGICAL PATHOLOGY Arvil Persons, DO 03/18/2022 0929         DRAINS/ITEMS PLACED:  None    OPERATIVE INDICATIONS:  Christina Taylor is a 21 y.o. female who presented to my office with complaints of post-prandial abdominal pain and imaging consistent with biliary dyskinesia. I have explained the risks, benefits, alternatives, and potential complications associated with the proposed procedure to be performed and other issues when applicable with the patient/responsible party prior to the procedure. All of their questions were answered as appropriate and to their satisfaction. They understand and agree to the procedure.      OPERATIVE FINDINGS:  Edematous and distended gallbladder consistent with likely acute cholecystitis  Fatty liver    OPERATIVE DETAILS:  The patient was brought to the operating room and placed on the operating table in the supine position with all pressure points padded appropriately. Antibiotics were administered per SCIP criteria. Sequential compression devices were applied to both legs and were confirmed to be on and functioning. General endotracheal anesthesia was induced without incident. The abdomen was then prepped and draped in the standard sterile fashion using Chloraprep. A pre-operative timeout was performed by the circulating nurse.     An 19mm incision was made two finger breadths below the left  costal margin and a Veress needle was inserted into the peritoneal cavity. A saline drop test was performed to confirm intraperitoneal position. The abdomen was then insufflated to 15 mmHg without issue. The Veress needle was removed and a 5 mm laparoscope in an 8 mm robotic optical trocar was used to gain access to the peritoneal cavity under direct visualization. The area of Veress needle and initial trocar entry were examined and there were no intra-abdominal injuries noted. We then placed three additional 8 mm robotic trocars: one to the left of the umbilicus, one to the right of the umbilicus, and one in the right lateral abdomen. The robot was then docked in standard fashion and I proceeded to the surgeon console.    The gallbladder was distended but able to be grasped. The gallbladder fundus was retracted cephalad over the liver edge. There was acute inflammation and edema of the gallbladder near the infundibulum. The infundibulum of the gallbladder was grasped and retracted to provide tension. Using electrocautery, the peritoneum over the infundibulum was carefully taken down both medially and laterally to expose the critical structures. This peritoneal dissection was carried up both sides of the gallbladder for better mobilization. I then skeletonized both the cystic duct and cystic artery using blunt dissection and careful bursts of cautery. The cystic plate was then exposed using similar dissection techniques. This provided the critical view of safety. The cystic duct and cystic artery were each clipped three times and divided with scissors, leaving two clips on  the patient side and one on the specimen side. The gallbladder was then dissected off of the liver bed using electrocautery and placed into an 8 mm laparoscopic specimen retrieval bag and removed via the right lateral port site. The fascia at this port site was reapproximated using 0 Vicryl on a suture passer.     The robot was then undocked,  pneumoperitoneum evacuated, and all trocars were removed. All of the skin incisions were closed with buried 4-0 Monocryl and dressed with Dermabond.    All counts were correct prior to final skin closure. Following the conclusion of the procedure, the patient was aroused from anesthesia and transferred in stable condition to the PACU.     Arvil Persons, DO  03/18/22  10:11 AM

## 2022-03-18 NOTE — Progress Notes (Signed)
MEDICATING FOR PAIN SEE MAR TOLERATING OFF O2

## 2022-03-18 NOTE — Discharge Instructions (Addendum)
You have surgical skin glue over your incisions. Do not pick or peel this off, it will come off slowly over time.  You can start showering normally tomorrow, just let warm soapy water run over your incisions, do not scrub.  Do not take a bath or go swimming for 2 weeks to allow your skin to heal.     Feel free to call the office with any questions or concerns!

## 2022-03-18 NOTE — Discharge Instructions (Signed)
Stay active, ambulate at least 3 times per day.  Stairs are okay.  Do not lift, push, or pull anything greater than 20 pounds for about 4 weeks.

## 2022-03-18 NOTE — Discharge Instructions (Signed)
03/18/2022        RE: [name]         [address]  [place] [address]    To Whom It May Concern,      Due to medical reasons, Christina Taylor may not return to classes until the week of September 4th, 2023.     Sincerely,          Arvil Persons, DO

## 2022-03-23 LAB — SURGICAL PATHOLOGY REPORT

## 2022-04-02 ENCOUNTER — Encounter: Payer: PRIVATE HEALTH INSURANCE | Primary: Family

## 2022-04-09 ENCOUNTER — Ambulatory Visit: Admit: 2022-04-09 | Discharge: 2022-04-09 | Payer: PRIVATE HEALTH INSURANCE | Primary: Family

## 2022-04-09 DIAGNOSIS — K828 Other specified diseases of gallbladder: Secondary | ICD-10-CM

## 2022-04-09 NOTE — Progress Notes (Signed)
General Surgery Post-Op Visit    Subjective:  Doing well with recovery after robotic cholecystectomy. Tolerating regular diet without nausea or vomiting. Pain is controlled. Returning to baseline.    Objective:  Vitals:    04/09/22 0829   BP: (!) 167/92   Pulse: 96   Resp: 18   Temp: 97.3 F (36.3 C)   SpO2: 98%      GEN: awake, alert, NAD  HEENT: NCAT  Resp: breathing comfortably on room air  CV: regular rate, intact distal pulses  Abd: soft, non-distended, non-tender, incisions healing well  Skin: warm and well perfused    Assessment/Plan:  Christina Taylor is recovering as expected from robotic cholecystectomy on 03/18/2022.    Path reviewed, benign chronic cholecystitis  Follow up PRN, okay to go back to the gym    Arvil Persons, DO  8:36 AM
